# Patient Record
Sex: Female | Born: 2000 | Race: White | Hispanic: No | Marital: Single | Smoking: Never smoker
Health system: Southern US, Community
[De-identification: ages and names within clinical notes are randomized; demographics above are authoritative.]

---
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
## 2022-04-11 ENCOUNTER — Ambulatory Visit: Payer: No Typology Code available for payment source | Admitting: Internal Medicine

## 2022-04-11 ENCOUNTER — Encounter: Payer: Self-pay | Admitting: Internal Medicine

## 2022-04-11 VITALS — BP 118/76 | HR 76 | Ht 61.0 in | Wt 161.0 lb

## 2022-04-11 DIAGNOSIS — R002 Palpitations: Secondary | ICD-10-CM

## 2022-04-11 DIAGNOSIS — R55 Syncope and collapse: Secondary | ICD-10-CM

## 2022-04-11 NOTE — Progress Notes (Signed)
Primary Physician/Referring:  Patient, No Pcp Per  Patient ID: Darlene Waller, female    DOB: 05/26/2000, 22 y.o.   MRN: [id number]  Chief Complaint  Patient presents with   Palpitations   New Patient (Initial Visit)   HPI:    Darlene Waller  is a 22 y.o. female with no reported past medical history who is here to establish care with cardiology due to palpitations and presyncope.  Patient states she has palpitations and very regularly and there is no real association with activity.  She does notice that they happen more often when she is more stressed out.  Of note she almost had an episode where she passed out but she never lost consciousness she just thought she was going to pass out.  Patient was bent over for a really long time and when she stood up straight she felt very lightheaded and thought she was going to pass out but she did not.  Patient states that she drinks plenty of water throughout the day she feels her 32 ounce water bottle 3 times a day and finishes at all.  She does admit that there is a lot of stress in her life that could be contributing to this.  She denies chest pain, shortness of breath, diaphoresis, syncope, edema, orthopnea, claudication, PND.  History reviewed. No pertinent past medical history. History reviewed. No pertinent surgical history. Family History  Problem Relation Age of Onset   Hypertension Paternal Grandmother    Stroke Paternal Grandmother    Hypertension Paternal Grandfather     Social History   Tobacco Use   Smoking status: Never   Smokeless tobacco: Never  Substance Use Topics   Alcohol use: Never   Marital Status: Single  ROS  Review of Systems  Cardiovascular:  Positive for irregular heartbeat, near-syncope and palpitations.   Objective  Blood pressure 118/76, pulse 76, height 5\' 1"  (1.549 m), weight 161 lb (73 kg), SpO2 98 %. Body mass index is 30.42 kg/m.     04/11/2022    9:43 AM 09/15/2011    1:03 AM 09/14/2011   11:47  PM  Vitals with BMI  Height 5\' 1"     Weight 161 lbs 95 lbs   BMI 07.37    Systolic 106  269  Diastolic 76  74  Pulse 76  112     Physical Exam Vitals reviewed.  HENT:     Head: Normocephalic and atraumatic.  Cardiovascular:     Rate and Rhythm: Normal rate and regular rhythm.     Pulses: Normal pulses.     Heart sounds: Normal heart sounds. No murmur heard. Pulmonary:     Effort: Pulmonary effort is normal.     Breath sounds: Normal breath sounds.  Abdominal:     General: Bowel sounds are normal.  Musculoskeletal:     Right lower leg: No edema.     Left lower leg: No edema.  Skin:    General: Skin is warm and dry.  Neurological:     Mental Status: She is alert.     Medications and allergies  No Known Allergies   Medication list after today's encounter   Current Outpatient Medications:    HAILEY 24 FE 1-20 MG-MCG(24) tablet, Take 1 tablet by mouth daily., Disp: , Rfl:   Laboratory examination:   No results found for: "NA", "K", "CO2", "GLUCOSE", "BUN", "CREATININE", "CALCIUM", "EGFR", "GFRNONAA"      No data to display  No data to display          Lipid Panel No results for input(s): "CHOL", "TRIG", "[place]", "VLDL", "HDL", "CHOLHDL", "LDLDIRECT" in the last 8760 hours.  HEMOGLOBIN A1C No results found for: "HGBA1C", "MPG" TSH No results for input(s): "TSH" in the last 8760 hours.  External labs:     Radiology:    Cardiac Studies:     EKG:   04/11/2022: Sinus Rhythm, normal axis, no ischemia.  Assessment     ICD-10-CM   1. Palpitations  R00.2 EKG 12-Lead    PCV ECHOCARDIOGRAM COMPLETE    2. Postural dizziness with presyncope  R42    R55        Orders Placed This Encounter  Procedures   EKG 12-Lead   PCV ECHOCARDIOGRAM COMPLETE    Standing Status:   Future    Standing Expiration Date:   04/12/2023    No orders of the defined types were placed in this encounter.   There are no discontinued medications.    Recommendations:   Chloie Loney is a 22 y.o.  female with palpitations  Palpitations Will obtain echocardiogram Propranolol PRN to be discussed next visit   Postural dizziness with presyncope Sounds orthostatic but patient urges she is very well hydrated filling her 32 oz water bottle 3x daily and drinking it all Orthostatic vital signs negative at today's visit Encourage decreased caffeine intake and compressions stockings Discussed changing positions slowly Follow-up in 1-2 months or sooner if needed     Darlene Flock, DO, [facility]  04/11/2022, 4:30 PM Office: [phone] Pager: [phone]

## 2022-04-18 ENCOUNTER — Ambulatory Visit: Payer: No Typology Code available for payment source

## 2022-04-18 DIAGNOSIS — R002 Palpitations: Secondary | ICD-10-CM

## 2022-04-19 NOTE — Progress Notes (Signed)
normal

## 2022-04-19 NOTE — Progress Notes (Signed)
Called and spoke with patient regarding her echocardiogram results.

## 2022-05-23 ENCOUNTER — Ambulatory Visit: Payer: No Typology Code available for payment source | Admitting: Internal Medicine

## 2022-05-30 ENCOUNTER — Ambulatory Visit: Payer: No Typology Code available for payment source | Admitting: Internal Medicine

## 2022-05-30 ENCOUNTER — Encounter: Payer: Self-pay | Admitting: Internal Medicine

## 2022-05-30 VITALS — BP 112/69 | HR 71 | Ht 61.0 in | Wt 165.0 lb

## 2022-05-30 DIAGNOSIS — R42 Dizziness and giddiness: Secondary | ICD-10-CM

## 2022-05-30 DIAGNOSIS — R002 Palpitations: Secondary | ICD-10-CM

## 2022-05-30 MED ORDER — PROPRANOLOL HCL 10 MG PO TABS: 10.0000 mg | ORAL_TABLET | Freq: Three times a day (TID) | ORAL | 8 refills | Status: AC | PRN

## 2022-05-30 NOTE — Progress Notes (Signed)
Primary Physician/Referring:  Darlene Waller, No Pcp Per  Darlene Waller ID: Darlene Waller, female    DOB: 08-07-2000, 22 y.o.   MRN: [id number]  Chief Complaint  Darlene Waller presents with   Palpitations   Follow-up   HPI:    Darlene Waller  is a 22 y.o. female with no reported past medical history who is here for a follow-up visit. She has been feeling ok since our last visit. She still doesn't know why her symptoms keep happening. She does admit to being under a lot of stress. Darlene Waller is agreeable to trying propranolol PRN for palpitations. She denies chest pain, shortness of breath, diaphoresis, syncope, edema, orthopnea, claudication, PND.  History reviewed. No pertinent past medical history. History reviewed. No pertinent surgical history. Family History  Problem Relation Age of Onset   Hypertension Paternal Grandmother    Stroke Paternal Grandmother    Hypertension Paternal Grandfather     Social History   Tobacco Use   Smoking status: Never   Smokeless tobacco: Never  Substance Use Topics   Alcohol use: Never   Marital Status: Single  ROS  Review of Systems  Cardiovascular:  Positive for irregular heartbeat, near-syncope and palpitations.   Objective  Blood pressure 112/69, pulse 71, height 5\' 1"  (1.549 m), weight 165 lb (74.8 kg), SpO2 99 %. Body mass index is 31.18 kg/m.     05/30/2022   11:07 AM 04/11/2022    9:43 AM 09/15/2011    1:03 AM  Vitals with BMI  Height 5\' 1"  5\' 1"    Weight 165 lbs 161 lbs 95 lbs  BMI [id number]   Systolic [id number]   Diastolic 69 76   Pulse 71 76      Physical Exam Vitals reviewed.  HENT:     Head: Normocephalic and atraumatic.  Cardiovascular:     Rate and Rhythm: Normal rate and regular rhythm.     Pulses: Normal pulses.     Heart sounds: Normal heart sounds. No murmur heard. Pulmonary:     Effort: Pulmonary effort is normal.     Breath sounds: Normal breath sounds.  Abdominal:     General: Bowel sounds are normal.   Musculoskeletal:     Right lower leg: No edema.     Left lower leg: No edema.  Skin:    General: Skin is warm and dry.  Neurological:     Mental Status: She is alert.     Medications and allergies  No Known Allergies   Medication list after today's encounter   Current Outpatient Medications:    HAILEY 24 FE 1-20 MG-MCG(24) tablet, Take 1 tablet by mouth daily., Disp: , Rfl:    propranolol (INDERAL) 10 MG tablet, Take 1 tablet (10 mg total) by mouth 3 (three) times daily as needed., Disp: 90 tablet, Rfl: 8  Laboratory examination:   No results found for: "NA", "K", "CO2", "GLUCOSE", "BUN", "CREATININE", "CALCIUM", "EGFR", "GFRNONAA"      No data to display             No data to display          Lipid Panel No results for input(s): "CHOL", "TRIG", "LDLCALC", "VLDL", "HDL", "CHOLHDL", "LDLDIRECT" in the last 8760 hours.  HEMOGLOBIN A1C No results found for: "HGBA1C", "MPG" TSH No results for input(s): "TSH" in the last 8760 hours.  External labs:     Radiology:    Cardiac Studies:   Echocardiogram 04/18/2022:  Normal LV systolic function with visual EF 60-65%.  Left ventricle cavity  is normal in size. Normal global wall motion. Normal diastolic filling  pattern, normal LAP. Calculated EF 65%.  Structurally normal tricuspid valve with no regurgitation. No evidence of  pulmonary hypertension.  No prior available for comparison.    EKG:   04/11/2022: Sinus Rhythm, normal axis, no ischemia.  Assessment     ICD-10-CM   1. Palpitations  R00.2     2. Postural dizziness with presyncope  R42    R55         No orders of the defined types were placed in this encounter.   Meds ordered this encounter  Medications   propranolol (INDERAL) 10 MG tablet    Sig: Take 1 tablet (10 mg total) by mouth 3 (three) times daily as needed.    Dispense:  90 tablet    Refill:  8    There are no discontinued medications.   Recommendations:   Darlene Waller is a 22 y.o.  female with palpitations  Palpitations No structural heart disease on echo Propranolol PRN 10 mg TID sent   Postural dizziness with presyncope Encourage decreased caffeine intake and compressions stockings Discussed changing positions slowly Follow-up in 6-12 months or sooner if needed     Floydene Flock, DO, [facility]  05/30/2022, 3:17 PM Office: [phone] Pager: [phone]

## 2022-11-28 ENCOUNTER — Ambulatory Visit: Payer: No Typology Code available for payment source | Admitting: Cardiology
